# Patient Record
Sex: Female | Born: 1980 | Race: White | Hispanic: No | Marital: Married | Smoking: Never smoker
Health system: Southern US, Community
[De-identification: ages and names within clinical notes are randomized; demographics above are authoritative.]

---

## 2014-06-09 ENCOUNTER — Encounter: Payer: Self-pay | Admitting: Gynecology

## 2014-06-09 ENCOUNTER — Ambulatory Visit
Admission: EM | Admit: 2014-06-09 | Discharge: 2014-06-09 | Disposition: A | Discharge status: Home / Self Care | Payer: BLUE CROSS/BLUE SHIELD | Attending: Family Medicine | Admitting: Family Medicine

## 2014-06-09 DIAGNOSIS — R81 Glycosuria: Secondary | ICD-10-CM | POA: Diagnosis not present

## 2014-06-09 DIAGNOSIS — N390 Urinary tract infection, site not specified: Secondary | ICD-10-CM | POA: Diagnosis not present

## 2014-06-09 DIAGNOSIS — H66003 Acute suppurative otitis media without spontaneous rupture of ear drum, bilateral: Secondary | ICD-10-CM | POA: Diagnosis not present

## 2014-06-09 DIAGNOSIS — Z79899 Other long term (current) drug therapy: Secondary | ICD-10-CM | POA: Insufficient documentation

## 2014-06-09 LAB — URINALYSIS COMPLETE WITH MICROSCOPIC (ARMC ONLY)
Glucose, UA: 100 mg/dL — AB
Nitrite: POSITIVE — AB
PROTEIN: 100 mg/dL — AB
Specific Gravity, Urine: 1.015 (ref 1.005–1.030)
pH: 7 (ref 5.0–8.0)

## 2014-06-09 MED ORDER — CEFDINIR 300 MG PO CAPS: 300.0000 mg | ORAL_CAPSULE | Freq: Two times a day (BID) | ORAL | Status: AC

## 2014-06-09 MED ORDER — CETIRIZINE HCL 10 MG PO TABS: 10.0000 mg | ORAL_TABLET | Freq: Every day | ORAL | Status: AC

## 2014-06-09 NOTE — ED Provider Notes (Addendum)
CSN: [id number]     Arrival date & time 06/09/14  1233 History   First MD Initiated Contact with Patient 06/09/14 1319     Chief Complaint  Patient presents with  . Urinary Tract Infection  . Ear Fullness   (Consider location/radiation/quality/duration/timing/severity/associated sxs/prior Treatment) HPI Comments: Caucasian female with 3 days dysuria, suprapubic cramping/pressure taking azo OTC helps a little but doesn't resolve symptoms, nausea and bilateral ear pain left greater than right feeling full  Patient is a 34 y.o. female presenting with urinary tract infection and plugged ear sensation. The history is provided by the patient.  Urinary Tract Infection Pain quality:  Burning Pain severity:  Mild Onset quality:  Sudden Duration:  3 days Timing:  Constant Progression:  Unchanged Chronicity:  New Recent urinary tract infections: yes   Relieved by:  Nothing Ineffective treatments:  Phenazopyridine and cranberry juice Urinary symptoms: discolored urine and frequent urination   Urinary symptoms: no foul-smelling urine, no hematuria, no hesitancy and no bladder incontinence   Associated symptoms: abdominal pain and nausea   Associated symptoms: no fever, no flank pain, no genital lesions, no vaginal discharge and no vomiting   Abdominal pain:    Location:  Suprapubic   Quality:  Pressure   Severity:  Mild   Onset quality:  Sudden   Duration:  3 days   Timing:  Constant   Progression:  Unchanged   Chronicity:  New Nausea:    Severity:  Mild   Onset quality:  Sudden   Duration:  3 days   Timing:  Constant   Progression:  Unchanged Risk factors: recurrent urinary tract infections and sexually active   Risk factors: no hx of pyelonephritis, no hx of urolithiasis, no kidney transplant, not pregnant, no renal cysts, no renal disease, not single kidney, no sexually transmitted infections and no urinary catheter   Ear Fullness Associated symptoms include abdominal pain.  Pertinent negatives include no chest pain, no headaches and no shortness of breath.    History reviewed. No pertinent past medical history. History reviewed. No pertinent past surgical history. History reviewed. No pertinent family history. History  Substance Use Topics  . Smoking status: Never Smoker   . Smokeless tobacco: Not on file  . Alcohol Use: No   OB History    No data available     Review of Systems  Constitutional: Negative for fever, chills, diaphoresis, activity change, appetite change and fatigue.  HENT: Positive for ear pain. Negative for congestion, dental problem, drooling, ear discharge, facial swelling, hearing loss, mouth sores, nosebleeds, postnasal drip, rhinorrhea, sinus pressure, sneezing, sore throat, tinnitus, trouble swallowing and voice change.   Eyes: Negative for photophobia, pain, discharge, redness, itching and visual disturbance.  Respiratory: Negative for cough, choking, chest tightness, shortness of breath, wheezing and stridor.   Cardiovascular: Negative for chest pain, palpitations and leg swelling.  Gastrointestinal: Positive for nausea and abdominal pain. Negative for vomiting, diarrhea, constipation, blood in stool, abdominal distention, anal bleeding and rectal pain.  Endocrine: Negative for cold intolerance and heat intolerance.  Genitourinary: Positive for dysuria, urgency and frequency. Negative for hematuria, flank pain, decreased urine volume, vaginal bleeding, vaginal discharge, enuresis, difficulty urinating, genital sores, vaginal pain and dyspareunia.  Musculoskeletal: Negative for myalgias, back pain, joint swelling, arthralgias, gait problem, neck pain and neck stiffness.  Skin: Negative for color change, pallor, rash and wound.  Allergic/Immunologic: Negative for environmental allergies and food allergies.  Neurological: Negative for dizziness, tremors, syncope, facial asymmetry, speech difficulty, weakness, light-headedness,  numbness  and headaches.  Hematological: Negative for adenopathy. Does not bruise/bleed easily.  Psychiatric/Behavioral: Negative for behavioral problems, confusion, sleep disturbance and agitation.    Allergies  Review of patient's allergies indicates no known allergies.  Home Medications   Prior to Admission medications   Medication Sig Start Date End Date Taking? Authorizing Provider  cefdinir (OMNICEF) 300 MG capsule Take 1 capsule (300 mg total) by mouth 2 (two) times daily. 06/09/14   Barbaraann Barthelina A Betancourt, NP  cetirizine (ZYRTEC) 10 MG tablet Take 1 tablet (10 mg total) by mouth daily. 06/09/14   Barbaraann Barthelina A Betancourt, NP   BP 122/73 mmHg  Pulse 70  Temp(Src) 98.4 F (36.9 C) (Oral)  Ht 5\' 4"  (1.626 m)  Wt 250 lb (113.399 kg)  BMI 42.89 kg/m2  SpO2 98%  LMP 05/27/2014 Physical Exam  Constitutional: She is oriented to person, place, and time. Vital signs are normal. She appears well-developed and well-nourished. She is cooperative.  Non-toxic appearance. She does not have a sickly appearance. She does not appear ill. No distress.  HENT:  Head: Normocephalic and atraumatic.  Right Ear: Hearing and external ear normal. Tympanic membrane is injected. A middle ear effusion is present.  Left Ear: Hearing, external ear and ear canal normal. Tympanic membrane is injected. A middle ear effusion is present.  Nose: Nose normal.  Mouth/Throat: Oropharynx is clear and moist. No oropharyngeal exudate.  Bilateral TMs with opaque air fluid level erythema excoriation of TM vasculature left greater than right; cobblestoning posterior pharynx; discussed to stop qtip use in ears scaling skin erythema external canal  Eyes: Conjunctivae, EOM and lids are normal. Pupils are equal, round, and reactive to light. Right eye exhibits no discharge. Left eye exhibits no discharge. No scleral icterus.  Neck: Trachea normal and normal range of motion. Neck supple. No tracheal deviation present. No thyromegaly present.   Cardiovascular: Normal rate, regular rhythm, normal heart sounds and intact distal pulses.  Exam reveals no gallop and no friction rub.   No murmur heard. Pulmonary/Chest: Effort normal and breath sounds normal. No respiratory distress. She has no wheezes. She has no rales. She exhibits no tenderness.  Abdominal: Soft. Bowel sounds are normal. She exhibits no distension and no mass. There is no hepatosplenomegaly. There is tenderness in the suprapubic area. There is no rebound, no guarding and no CVA tenderness. No hernia. Hernia confirmed negative in the ventral area.  Dull to percussion x 4 quads  Musculoskeletal: Normal range of motion. She exhibits no edema.  Lymphadenopathy:    She has no cervical adenopathy.  Neurological: She is alert and oriented to person, place, and time. Coordination normal.  Skin: Skin is warm, dry and intact. No rash noted. She is not diaphoretic. No erythema. No pallor.  Psychiatric: She has a normal mood and affect. Her speech is normal and behavior is normal. Judgment and thought content normal. Cognition and memory are normal.  Nursing note and vitals reviewed.   ED Course  Procedures (including critical care time) Labs Review Labs Reviewed  URINALYSIS COMPLETEWITH MICROSCOPIC ([facility] ONLY) - Abnormal; Notable for the following:    Color, Urine ORANGE (*)    Glucose, UA 100 (*)    Bilirubin Urine 1+ (*)    Ketones, ur TRACE (*)    Hgb urine dipstick TRACE (*)    Protein, ur 100 (*)    Nitrite POSITIVE (*)    Leukocytes, UA 3+ (*)    Bacteria, UA FEW (*)    Squamous  Epithelial / LPF 6-30 (*)    All other components within normal limits  URINE CULTURE    Imaging Review No results found.   MDM   1. UTI (lower urinary tract infection)   2. Glucosuria   3. Acute suppurative otitis media of both ears without spontaneous rupture of tympanic membranes, recurrence not specified    Reviewed urinalysis results with patient discussed all abnormal  findings can occur with UTI and use of azo.  Medications as directed using ceftin  po BID to cover for otitis and cystitis.   Discussed will call with urine culture results once available in 48 hours.   Patient to schedule follow up with [facility] for repeat urinalysis after symptoms resolve recheck if glucosuria.  Patient given copy of urinalysis results.  Patient is also to push fluids and may use OTC Azo x 48 hours po TID as needed. Patient asked why two infections in past year.  Discussed with patient if she is chronically spilling glucose in urine can increase change of UTI/bacterial growth.  Patient to be checked for diabetes by PCM.  Reported had [facility] glucose check and it was normal within the past month--recommended urinalysis and HgbA1c check.  Obesity encouraged weight loss.  Call or return to clinic as needed if these symptoms worsen or fail to improve as anticipated.  Exitcare handout on cystitis given to patient Patient verbalized agreement and understanding of treatment plan and had no further questions at this time. P2:  Hydrate and cranberry juice Treatment as ordered.  Symptomatic therapy suggested fluids, NSAIDs and rest.  May take Tylenol or Motrin for fevers.  Call or return to clinic as needed if these symptoms worsen or fail to improve as anticipated. Exitcare handout on otitis media given to patient.  Patient verbalized agreement and understanding of treatment plan.   P2:  Hand washing   Barbaraann Barthel, NP 06/09/14 1647  17 Jun 2014 at 1532 Telephone message left for patient urine culture results with e.coli sensitive to cephalosporins; omnicef should have been effective wanting to know if her symptoms have resolved and to contact clinic.  Barbaraann Barthel, NP 06/17/14 [phone]

## 2014-06-09 NOTE — Discharge Instructions (Signed)
Allergic Rhinitis Allergic rhinitis is when the mucous membranes in the nose respond to allergens. Allergens are particles in the air that cause your body to have an allergic reaction. This causes you to release allergic antibodies. Through a chain of events, these eventually cause you to release histamine into the blood stream. Although meant to protect the body, it is this release of histamine that causes your discomfort, such as frequent sneezing, congestion, and an itchy, runny nose.  CAUSES  Seasonal allergic rhinitis (hay fever) is caused by pollen allergens that may come from grasses, trees, and weeds. Year-round allergic rhinitis (perennial allergic rhinitis) is caused by allergens such as house dust mites, pet dander, and mold spores.  SYMPTOMS   Nasal stuffiness (congestion).  Itchy, runny nose with sneezing and tearing of the eyes. DIAGNOSIS  Your health care provider can help you determine the allergen or allergens that trigger your symptoms. If you and your health care provider are unable to determine the allergen, skin or blood testing may be used. TREATMENT  Allergic rhinitis does not have a cure, but it can be controlled by:  Medicines and allergy shots (immunotherapy).  Avoiding the allergen. Hay fever may often be treated with antihistamines in pill or nasal spray forms. Antihistamines block the effects of histamine. There are over-the-counter medicines that may help with nasal congestion and swelling around the eyes. Check with your health care provider before taking or giving this medicine.  If avoiding the allergen or the medicine prescribed do not work, there are many new medicines your health care provider can prescribe. Stronger medicine may be used if initial measures are ineffective. Desensitizing injections can be used if medicine and avoidance does not work. Desensitization is when a patient is given ongoing shots until the body becomes less sensitive to the allergen.  Make sure you follow up with your health care provider if problems continue. HOME CARE INSTRUCTIONS It is not possible to completely avoid allergens, but you can reduce your symptoms by taking steps to limit your exposure to them. It helps to know exactly what you are allergic to so that you can avoid your specific triggers. SEEK MEDICAL CARE IF:   You have a fever.  You develop a cough that does not stop easily (persistent).  You have shortness of breath.  You start wheezing.  Symptoms interfere with normal daily activities. Document Released: 09/15/2000 Document Revised: 12/26/2012 Document Reviewed: 08/28/2012 [facility] Patient Information 2015 [place], [place]. This information is not intended to replace advice given to you by your health care provider. Make sure you discuss any questions you have with your health care provider. Otitis Media Otitis media is redness, soreness, and inflammation of the middle ear. Otitis media may be caused by allergies or, most commonly, by infection. Often it occurs as a complication of the common cold. SIGNS AND SYMPTOMS Symptoms of otitis media may include:  Earache.  Fever.  Ringing in your ear.  Headache.  Leakage of fluid from the ear. DIAGNOSIS To diagnose otitis media, your health care provider will examine your ear with an otoscope. This is an instrument that allows your health care provider to see into your ear in order to examine your eardrum. Your health care provider also will ask you questions about your symptoms. TREATMENT  Typically, otitis media resolves on its own within 3-5 days. Your health care provider may prescribe medicine to ease your symptoms of pain. If otitis media does not resolve within 5 days or is recurrent, your  health care provider may prescribe antibiotic medicines if he or she suspects that a bacterial infection is the cause. HOME CARE INSTRUCTIONS   If you were prescribed an antibiotic medicine, finish it all  even if you start to feel better.  Take medicines only as directed by your health care provider.  Keep all follow-up visits as directed by your health care provider. SEEK MEDICAL CARE IF:  You have otitis media only in one ear, or bleeding from your nose, or both.  You notice a lump on your neck.  You are not getting better in 3-5 days.  You feel worse instead of better. SEEK IMMEDIATE MEDICAL CARE IF:   You have pain that is not controlled with medicine.  You have swelling, redness, or pain around your ear or stiffness in your neck.  You notice that part of your face is paralyzed.  You notice that the bone behind your ear (mastoid) is tender when you touch it. MAKE SURE YOU:   Understand these instructions.  Will watch your condition.  Will get help right away if you are not doing well or get worse. Document Released: 09/26/2003 Document Revised: 05/07/2013 Document Reviewed: 07/18/2012 [facility] Patient Information 2015 [place], [place]. This information is not intended to replace advice given to you by your health care provider. Make sure you discuss any questions you have with your health care provider. Glucose, Urine This test measures the level of sugar (glucose) in your urine. Glucose in your urine suggests diabetes or blood glucose level problems. Normally when blood passes through the kidneys, the glucose is reabsorbed. When the blood glucose level is too high, the glucose will then pass into the urine. This is not a sensitive test for diabetes and is not used as much anymore. PREPARATION FOR TEST No preparation or fasting is necessary. NORMAL FINDINGS  Random specimen: Negative  24-hour specimen: Less than 0.5 g/day or less than 2.78 mmol/day Ranges for normal findings may vary among different laboratories and hospitals. You should always check with your doctor after having lab work or other tests done to discuss the meaning of your test results and whether your  values are considered within normal limits. MEANING OF TEST  Your caregiver will go over the test results with you and discuss the importance and meaning of your results, as well as treatment options and the need for additional tests. OBTAINING THE TEST RESULTS It is your responsibility to obtain your test results. Ask the lab or department performing the test when and how you will get your results. Document Released: 01/14/2004 Document Revised: 03/15/2011 Document Reviewed: 12/02/2007 [facility] Patient Information 2015 [place], [place]. This information is not intended to replace advice given to you by your health care provider. Make sure you discuss any questions you have with your health care provider. Urinary Tract Infection Urinary tract infections (UTIs) can develop anywhere along your urinary tract. Your urinary tract is your body's drainage system for removing wastes and extra water. Your urinary tract includes two kidneys, two ureters, a bladder, and a urethra. Your kidneys are a pair of bean-shaped organs. Each kidney is about the size of your fist. They are located below your ribs, one on each side of your spine. CAUSES Infections are caused by microbes, which are microscopic organisms, including fungi, viruses, and bacteria. These organisms are so small that they can only be seen through a microscope. Bacteria are the microbes that most commonly cause UTIs. SYMPTOMS  Symptoms of UTIs may vary by age  and gender of the patient and by the location of the infection. Symptoms in young women typically include a frequent and intense urge to urinate and a painful, burning feeling in the bladder or urethra during urination. Older women and men are more likely to be tired, shaky, and weak and have muscle aches and abdominal pain. A fever may mean the infection is in your kidneys. Other symptoms of a kidney infection include pain in your back or sides below the ribs, nausea, and vomiting. DIAGNOSIS To  diagnose a UTI, your caregiver will ask you about your symptoms. Your caregiver also will ask to provide a urine sample. The urine sample will be tested for bacteria and white blood cells. White blood cells are made by your body to help fight infection. TREATMENT  Typically, UTIs can be treated with medication. Because most UTIs are caused by a bacterial infection, they usually can be treated with the use of antibiotics. The choice of antibiotic and length of treatment depend on your symptoms and the type of bacteria causing your infection. HOME CARE INSTRUCTIONS  If you were prescribed antibiotics, take them exactly as your caregiver instructs you. Finish the medication even if you feel better after you have only taken some of the medication.  Drink enough water and fluids to keep your urine clear or pale yellow.  Avoid caffeine, tea, and carbonated beverages. They tend to irritate your bladder.  Empty your bladder often. Avoid holding urine for long periods of time.  Empty your bladder before and after sexual intercourse.  After a bowel movement, women should cleanse from front to back. Use each tissue only once. SEEK MEDICAL CARE IF:   You have back pain.  You develop a fever.  Your symptoms do not begin to resolve within 3 days. SEEK IMMEDIATE MEDICAL CARE IF:   You have severe back pain or lower abdominal pain.  You develop chills.  You have nausea or vomiting.  You have continued burning or discomfort with urination. MAKE SURE YOU:   Understand these instructions.  Will watch your condition.  Will get help right away if you are not doing well or get worse. Document Released: 09/30/2004 Document Revised: 06/22/2011 Document Reviewed: 01/29/2011 [facility] Patient Information 2015 [place], [place]. This information is not intended to replace advice given to you by your health care provider. Make sure you discuss any questions you have with your health care provider.

## 2014-06-09 NOTE — ED Notes (Signed)
Patient c/o frequent / burning / cramping urination, 3 days. Pt also c/o left ear fullness.

## 2014-06-13 LAB — URINE CULTURE: Culture: 50000

## 2014-06-17 ENCOUNTER — Telehealth: Payer: Self-pay

## 2014-06-17 NOTE — ED Notes (Signed)
Pt returning phone call of Provider. States she is feeling much better, and will continue her antibiotic.

## 2017-10-20 ENCOUNTER — Other Ambulatory Visit: Payer: Self-pay | Admitting: Medical Oncology

## 2017-10-20 DIAGNOSIS — E041 Nontoxic single thyroid nodule: Secondary | ICD-10-CM

## 2017-10-31 ENCOUNTER — Ambulatory Visit
Admission: RE | Admit: 2017-10-31 | Discharge: 2017-10-31 | Disposition: A | Discharge status: Home / Self Care | Payer: Managed Care, Other (non HMO) | Source: Ambulatory Visit | Attending: Medical Oncology | Admitting: Medical Oncology

## 2017-10-31 ENCOUNTER — Encounter (INDEPENDENT_AMBULATORY_CARE_PROVIDER_SITE_OTHER): Payer: Self-pay

## 2017-10-31 DIAGNOSIS — E041 Nontoxic single thyroid nodule: Secondary | ICD-10-CM | POA: Insufficient documentation

## 2020-05-26 IMAGING — US US THYROID
1 series · 13 of 25 positions shown · non-contrast
Comparison: None available

CLINICAL DATA: Nodule . Previous biopsy of right lesion 4995 at
outside institution.

EXAM:
THYROID ULTRASOUND
TECHNIQUE: Ultrasound examination of the thyroid gland and adjacent soft
tissues was performed.

[Series 1: us thyroid · 0.07mm/px · 13 of 49 slices shown]
[im 1/49]
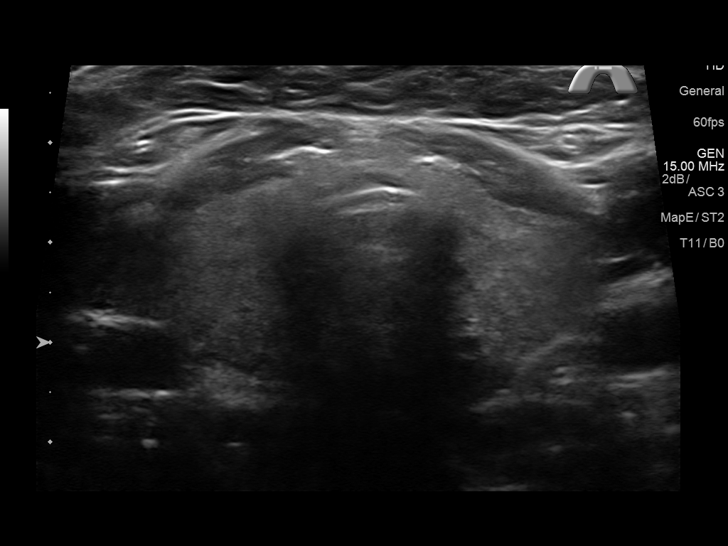
[im 5/49]
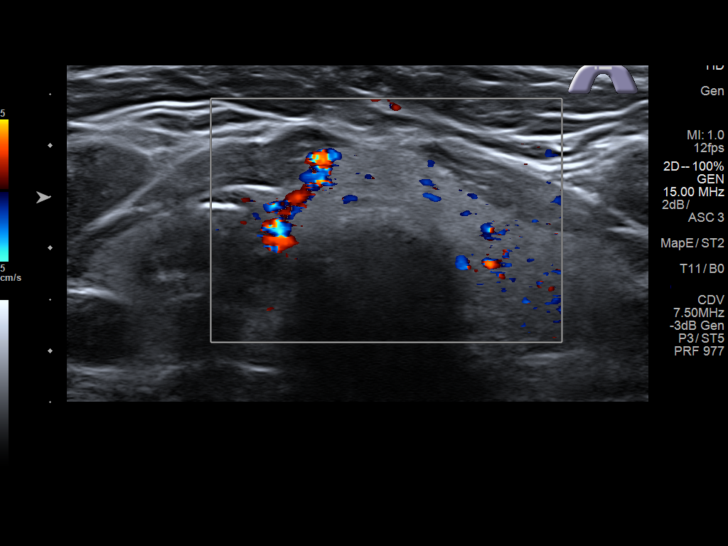
[im 9/49]
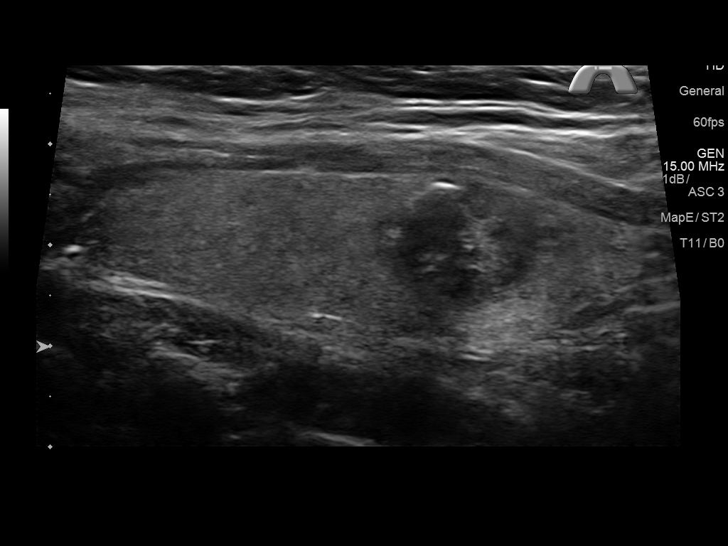
[im 13/49]
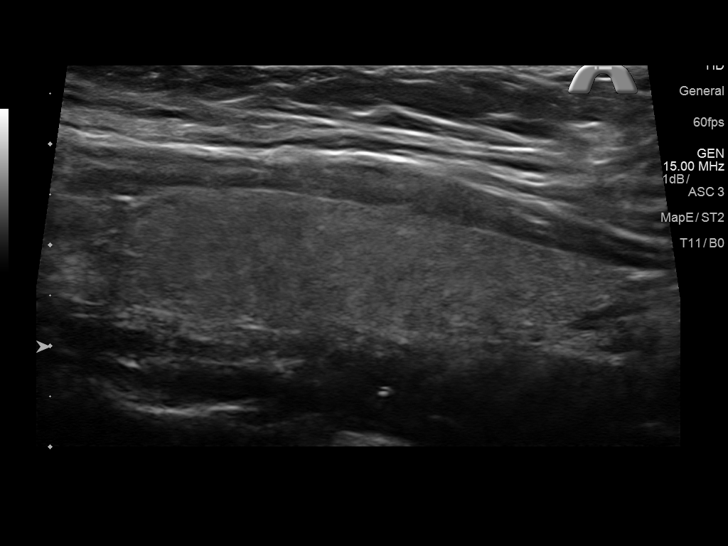
[im 17/49]
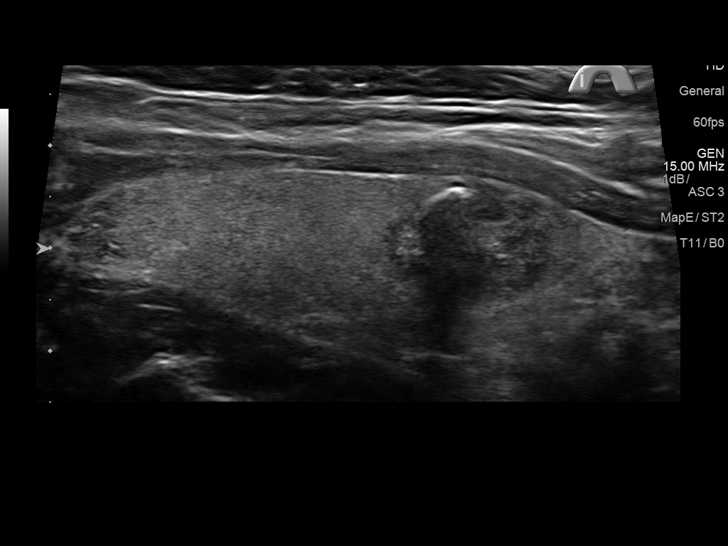
[im 21/49]
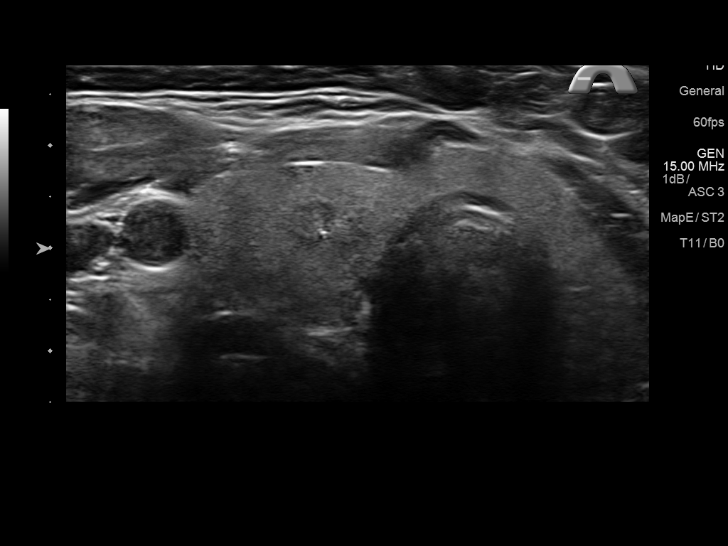
[im 25/49]
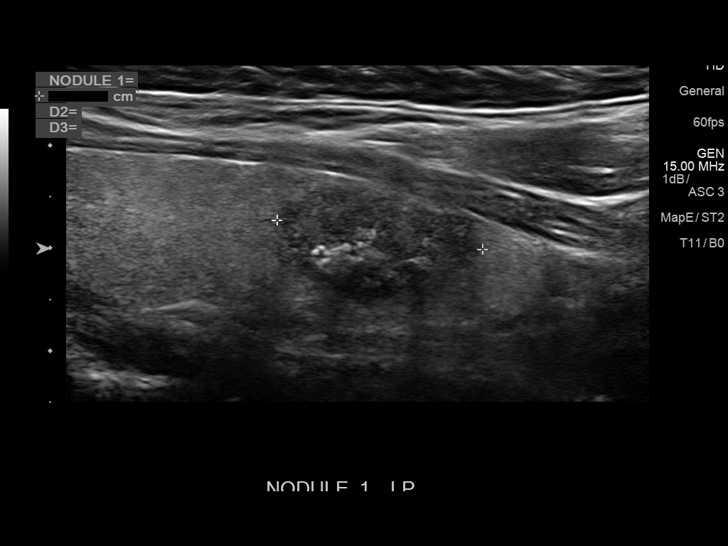
[im 29/49]
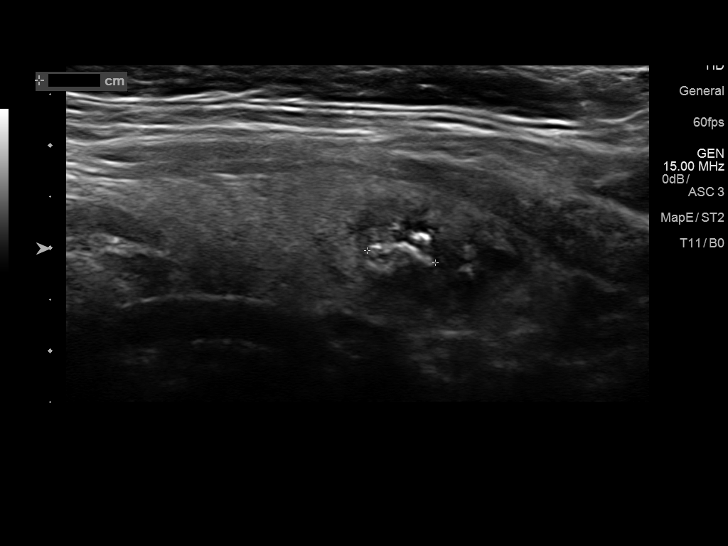
[im 33/49]
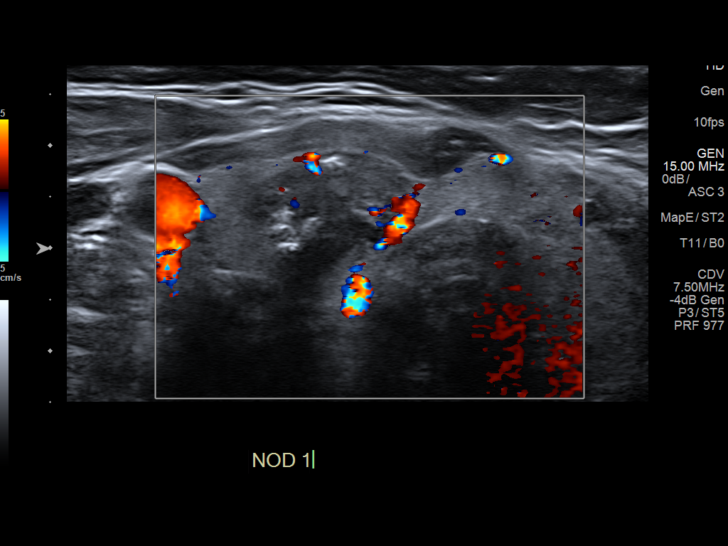
[im 37/49]
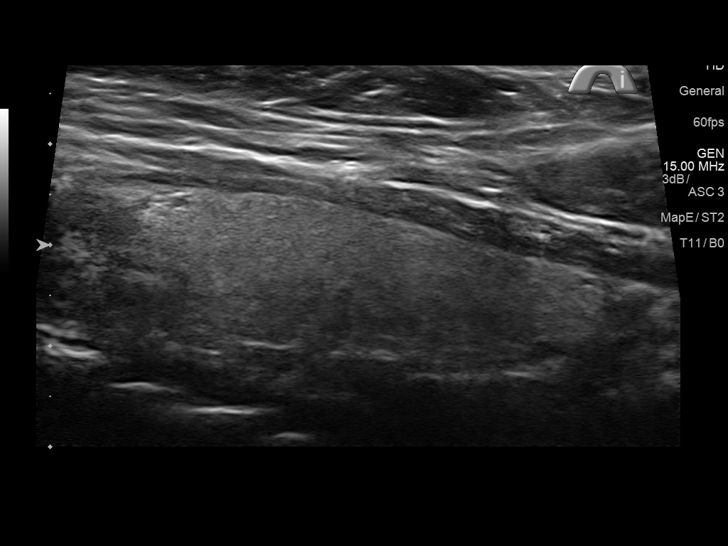
[im 41/49]
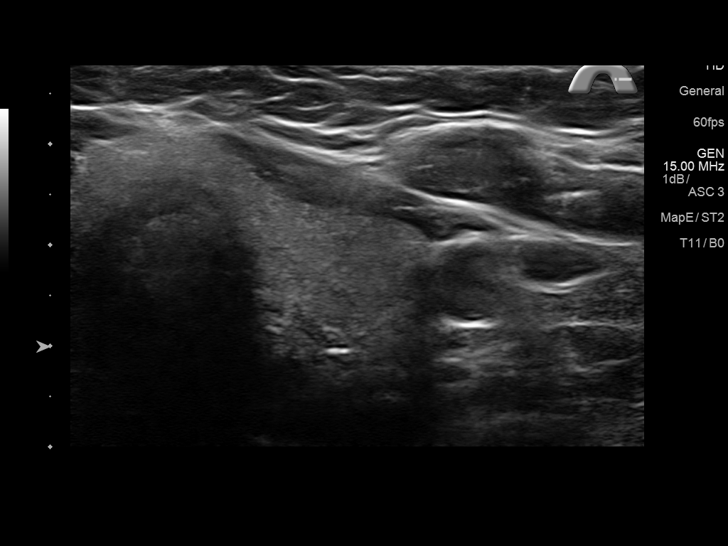
[im 45/49]
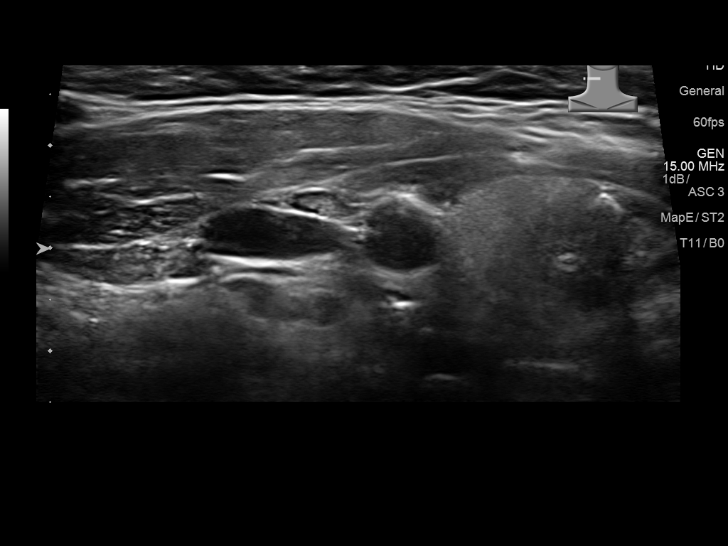
[im 49/49]
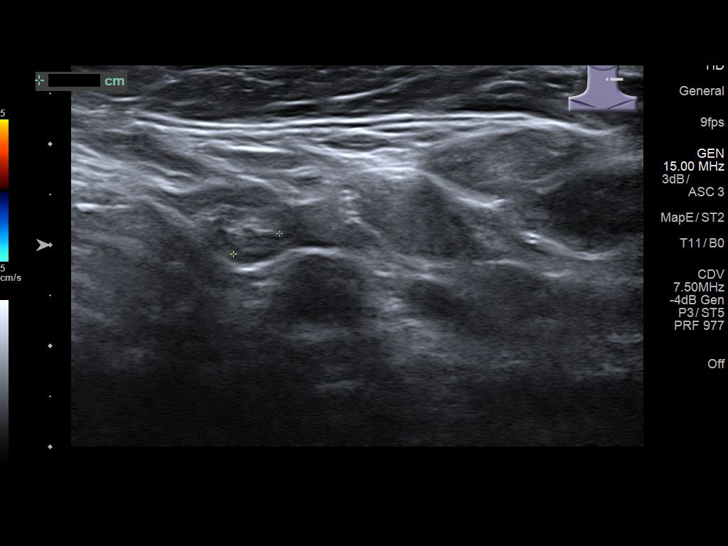

[13 of 25 positions shown; findings below may reference images not displayed]

FINDINGS: Parenchymal Echotexture: Mildly heterogenous

Isthmus: 0.7 cm thickness

Right lobe: 5.4 x 1.7 x 1.9 cm

Left lobe: 5.1 x 1.4 x 1.7 cm

_________________________________________________________

Estimated total number of nodules >/= 1 cm: 1

Number of spongiform nodules >/=  2 cm not described below (TR1): 0

Number of mixed cystic and solid nodules >/= 1.5 cm not described
below (TR2): 0

_________________________________________________________

Nodule # 1:

Location: Right; Inferior

Maximum size: 2 cm; Other 2 dimensions: 1.7 x 1.4 cm

Composition: solid/almost completely solid (2)

Echogenicity: hypoechoic (2)

Shape: not taller-than-wide (0)

Margins: ill-defined (0)

Echogenic foci: macrocalcifications (1)

ACR TI-RADS total points: 5.

ACR TI-RADS risk category: TR4 (4-6 points).

ACR TI-RADS recommendations:

**Given size (>/= 1.5 cm) and appearance, fine needle aspiration of
this moderately suspicious nodule should be considered based on
TI-RADS criteria (in the absence of any documentation that this is
the same nodule biopsied in 4995).

_________________________________________________________
IMPRESSION: 1. Mild thyromegaly with solitary 2 cm moderately suspicious right
nodule. If not already performed, recommend FNA biopsy.

The above is in keeping with the ACR TI-RADS recommendations - [HOSPITAL] 2393;[DATE].

## 2020-12-17 ENCOUNTER — Ambulatory Visit: Admit: 2020-12-17 | Discharge status: Absent without leave | Payer: Managed Care, Other (non HMO)
